# Patient Record
Sex: Male | Born: 1955 | Race: White | Hispanic: No | Marital: Single | Smoking: Never smoker
Health system: Southern US, Community
[De-identification: ages and names within clinical notes are randomized; demographics above are authoritative.]

## PROBLEM LIST (undated history)

## (undated) HISTORY — PX: JOINT REPLACEMENT: SHX530

---

## 2013-08-27 ENCOUNTER — Emergency Department (HOSPITAL_BASED_OUTPATIENT_CLINIC_OR_DEPARTMENT_OTHER): Payer: BC Managed Care – PPO

## 2013-08-27 ENCOUNTER — Emergency Department (HOSPITAL_BASED_OUTPATIENT_CLINIC_OR_DEPARTMENT_OTHER)
Admission: EM | Admit: 2013-08-27 | Discharge: 2013-08-27 | Disposition: A | Discharge status: Home / Self Care | Payer: BC Managed Care – PPO | Attending: Emergency Medicine | Admitting: Emergency Medicine

## 2013-08-27 ENCOUNTER — Encounter (HOSPITAL_BASED_OUTPATIENT_CLINIC_OR_DEPARTMENT_OTHER): Payer: Self-pay | Admitting: Emergency Medicine

## 2013-08-27 DIAGNOSIS — Y9389 Activity, other specified: Secondary | ICD-10-CM | POA: Insufficient documentation

## 2013-08-27 DIAGNOSIS — S40012A Contusion of left shoulder, initial encounter: Secondary | ICD-10-CM

## 2013-08-27 DIAGNOSIS — S0990XA Unspecified injury of head, initial encounter: Secondary | ICD-10-CM | POA: Insufficient documentation

## 2013-08-27 DIAGNOSIS — Y92410 Unspecified street and highway as the place of occurrence of the external cause: Secondary | ICD-10-CM | POA: Insufficient documentation

## 2013-08-27 DIAGNOSIS — S139XXA Sprain of joints and ligaments of unspecified parts of neck, initial encounter: Secondary | ICD-10-CM | POA: Insufficient documentation

## 2013-08-27 DIAGNOSIS — S40019A Contusion of unspecified shoulder, initial encounter: Secondary | ICD-10-CM | POA: Insufficient documentation

## 2013-08-27 DIAGNOSIS — IMO0002 Reserved for concepts with insufficient information to code with codable children: Secondary | ICD-10-CM | POA: Insufficient documentation

## 2013-08-27 DIAGNOSIS — S161XXA Strain of muscle, fascia and tendon at neck level, initial encounter: Secondary | ICD-10-CM

## 2013-08-27 DIAGNOSIS — S79929A Unspecified injury of unspecified thigh, initial encounter: Secondary | ICD-10-CM

## 2013-08-27 DIAGNOSIS — S79919A Unspecified injury of unspecified hip, initial encounter: Secondary | ICD-10-CM | POA: Insufficient documentation

## 2013-08-27 MED ORDER — TRAMADOL HCL 50 MG PO TABS: 50.0000 mg | ORAL_TABLET | Freq: Four times a day (QID) | ORAL | Status: AC | PRN
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
## 2013-08-27 NOTE — Discharge Instructions (Signed)
Tramadol as prescribed as needed for pain.   Cervical Sprain A cervical sprain is an injury in the neck in which the strong, fibrous tissues (ligaments) that connect your neck bones stretch or tear. Cervical sprains can range from mild to severe. Severe cervical sprains can cause the neck vertebrae to be unstable. This can lead to damage of the spinal cord and can result in serious nervous system problems. The amount of time it takes for a cervical sprain to get better depends on the cause and extent of the injury. Most cervical sprains heal in 1 to 3 weeks. CAUSES  Severe cervical sprains may be caused by:   Contact sport injuries (such as from football, rugby, wrestling, hockey, auto racing, gymnastics, diving, martial arts, or boxing).   Motor vehicle collisions.   Whiplash injuries. This is an injury from a sudden forward-and backward whipping movement of the head and neck.  Falls.  Mild cervical sprains may be caused by:   Being in an awkward position, such as while cradling a telephone between your ear and shoulder.   Sitting in a chair that does not offer proper support.   Working at a poorly Marketing executive station.   Looking up or down for long periods of time.  SYMPTOMS   Pain, soreness, stiffness, or a burning sensation in the front, back, or sides of the neck. This discomfort may develop immediately after the injury or slowly, 24 hours or more after the injury.   Pain or tenderness directly in the middle of the back of the neck.   Shoulder or upper back pain.   Limited ability to move the neck.   Headache.   Dizziness.   Weakness, numbness, or tingling in the hands or arms.   Muscle spasms.   Difficulty swallowing or chewing.   Tenderness and swelling of the neck.  DIAGNOSIS  Most of the time your health care provider can diagnose a cervical sprain by taking your history and doing a physical exam. Your health care provider will ask about  previous neck injuries and any known neck problems, such as arthritis in the neck. X-rays may be taken to find out if there are any other problems, such as with the bones of the neck. Other tests, such as a CT scan or MRI, may also be needed.  TREATMENT  Treatment depends on the severity of the cervical sprain. Mild sprains can be treated with rest, keeping the neck in place (immobilization), and pain medicines. Severe cervical sprains are immediately immobilized. Further treatment is done to help with pain, muscle spasms, and other symptoms and may include:  Medicines, such as pain relievers, numbing medicines, or muscle relaxants.   Physical therapy. This may involve stretching exercises, strengthening exercises, and posture training. Exercises and improved posture can help stabilize the neck, strengthen muscles, and help stop symptoms from returning.  HOME CARE INSTRUCTIONS   Put ice on the injured area.   Put ice in a plastic bag.   Place a towel between your skin and the bag.   Leave the ice on for 15 20 minutes, 3 4 times a day.   If your injury was severe, you may have been given a cervical collar to wear. A cervical collar is a two-piece collar designed to keep your neck from moving while it heals.  Do not remove the collar unless instructed by your health care provider.  If you have long hair, keep it outside of the collar.  Ask your health  care provider before making any adjustments to your collar. Minor adjustments may be required over time to improve comfort and reduce pressure on your chin or on the back of your head.  Ifyou are allowed to remove the collar for cleaning or bathing, follow your health care provider's instructions on how to do so safely.  Keep your collar clean by wiping it with mild soap and water and drying it completely. If the collar you have been given includes removable pads, remove them every 1 2 days and hand wash them with soap and water. Allow  them to air dry. They should be completely dry before you wear them in the collar.  If you are allowed to remove the collar for cleaning and bathing, wash and dry the skin of your neck. Check your skin for irritation or sores. If you see any, tell your health care provider.  Do not drive while wearing the collar.   Only take over-the-counter or prescription medicines for pain, discomfort, or fever as directed by your health care provider.   Keep all follow-up appointments as directed by your health care provider.   Keep all physical therapy appointments as directed by your health care provider.   Make any needed adjustments to your workstation to promote good posture.   Avoid positions and activities that make your symptoms worse.   Warm up and stretch before being active to help prevent problems.  SEEK MEDICAL CARE IF:   Your pain is not controlled with medicine.   You are unable to decrease your pain medicine over time as planned.   Your activity level is not improving as expected.  SEEK IMMEDIATE MEDICAL CARE IF:   You develop any bleeding.  You develop stomach upset.  You have signs of an allergic reaction to your medicine.   Your symptoms get worse.   You develop new, unexplained symptoms.   You have numbness, tingling, weakness, or paralysis in any part of your body.  MAKE SURE YOU:   Understand these instructions.  Will watch your condition.  Will get help right away if you are not doing well or get worse. Document Released: 02/12/2007 Document Revised: 02/05/2013 Document Reviewed: 10/23/2012 [facility] LPExitCare Patient Information 2014 [employer], [place].  Contusion A contusion is a deep bruise. Contusions are the result of an injury that caused bleeding under the skin. The contusion may turn blue, purple, or yellow. Minor injuries will give you a painless contusion, but more severe contusions may stay painful and swollen for a few weeks.  CAUSES  A  contusion is usually caused by a blow, trauma, or direct force to an area of the body. SYMPTOMS   Swelling and redness of the injured area.  Bruising of the injured area.  Tenderness and soreness of the injured area.  Pain. DIAGNOSIS  The diagnosis can be made by taking a history and physical exam. An X-ray, CT scan, or MRI may be needed to determine if there were any associated injuries, such as fractures. TREATMENT  Specific treatment will depend on what area of the body was injured. In general, the best treatment for a contusion is resting, icing, elevating, and applying cold compresses to the injured area. Over-the-counter medicines may also be recommended for pain control. Ask your caregiver what the best treatment is for your contusion. HOME CARE INSTRUCTIONS   Put ice on the injured area.  Put ice in a plastic bag.  Place a towel between your skin and the bag.  Leave the ice  on for 15-20 minutes, 03-04 times a day.  Only take over-the-counter or prescription medicines for pain, discomfort, or fever as directed by your caregiver. Your caregiver may recommend avoiding anti-inflammatory medicines (aspirin, ibuprofen, and naproxen) for 48 hours because these medicines may increase bruising.  Rest the injured area.  If possible, elevate the injured area to reduce swelling. SEEK IMMEDIATE MEDICAL CARE IF:   You have increased bruising or swelling.  You have pain that is getting worse.  Your swelling or pain is not relieved with medicines. MAKE SURE YOU:   Understand these instructions.  Will watch your condition.  Will get help right away if you are not doing well or get worse. Document Released: 01/25/2005 Document Revised: 07/10/2011 Document Reviewed: 02/20/2011 [facility]Exit[facility] Patient Information 2014 [place], [place].

## 2013-08-27 NOTE — ED Notes (Signed)
MD at bedside. 

## 2015-05-10 ENCOUNTER — Ambulatory Visit: Payer: Self-pay | Admitting: Physician Assistant

## 2015-05-25 ENCOUNTER — Ambulatory Visit: Payer: Self-pay | Admitting: Physician Assistant

## 2015-06-25 ENCOUNTER — Ambulatory Visit: Payer: Self-pay | Admitting: Physician Assistant

## 2019-02-17 ENCOUNTER — Emergency Department (HOSPITAL_COMMUNITY)
Admission: EM | Admit: 2019-02-17 | Discharge: 2019-02-17 | Discharge status: Against Medical Advice | Payer: Self-pay | Attending: Emergency Medicine | Admitting: Emergency Medicine

## 2019-02-17 ENCOUNTER — Other Ambulatory Visit: Payer: Self-pay

## 2019-02-17 ENCOUNTER — Encounter (HOSPITAL_COMMUNITY): Payer: Self-pay | Admitting: Emergency Medicine

## 2019-02-17 DIAGNOSIS — Z5321 Procedure and treatment not carried out due to patient leaving prior to being seen by health care provider: Secondary | ICD-10-CM | POA: Insufficient documentation

## 2019-02-17 DIAGNOSIS — R42 Dizziness and giddiness: Secondary | ICD-10-CM | POA: Insufficient documentation

## 2019-02-17 MED ORDER — SODIUM CHLORIDE 0.9% FLUSH: 3.0000 mL | Freq: Once | INTRAVENOUS | Status: DC

## 2019-02-17 NOTE — ED Triage Notes (Signed)
Patient here from home with complaints of dizziness "all day today". Increased with standing and bending over. Nausea.

## 2020-06-20 ENCOUNTER — Emergency Department (HOSPITAL_BASED_OUTPATIENT_CLINIC_OR_DEPARTMENT_OTHER): Payer: Self-pay

## 2020-06-20 ENCOUNTER — Emergency Department (HOSPITAL_BASED_OUTPATIENT_CLINIC_OR_DEPARTMENT_OTHER)
Admission: EM | Admit: 2020-06-20 | Discharge: 2020-06-20 | Disposition: A | Discharge status: Home / Self Care | Payer: Self-pay | Attending: Emergency Medicine | Admitting: Emergency Medicine

## 2020-06-20 ENCOUNTER — Other Ambulatory Visit: Payer: Self-pay

## 2020-06-20 ENCOUNTER — Encounter (HOSPITAL_BASED_OUTPATIENT_CLINIC_OR_DEPARTMENT_OTHER): Payer: Self-pay | Admitting: Emergency Medicine

## 2020-06-20 DIAGNOSIS — Y9301 Activity, walking, marching and hiking: Secondary | ICD-10-CM | POA: Insufficient documentation

## 2020-06-20 DIAGNOSIS — W010XXA Fall on same level from slipping, tripping and stumbling without subsequent striking against object, initial encounter: Secondary | ICD-10-CM | POA: Insufficient documentation

## 2020-06-20 DIAGNOSIS — Z23 Encounter for immunization: Secondary | ICD-10-CM | POA: Insufficient documentation

## 2020-06-20 DIAGNOSIS — Z9660 Presence of unspecified orthopedic joint implant: Secondary | ICD-10-CM | POA: Insufficient documentation

## 2020-06-20 DIAGNOSIS — R2242 Localized swelling, mass and lump, left lower limb: Secondary | ICD-10-CM | POA: Insufficient documentation

## 2020-06-20 DIAGNOSIS — T148XXA Other injury of unspecified body region, initial encounter: Secondary | ICD-10-CM

## 2020-06-20 DIAGNOSIS — Y9261 Building [any] under construction as the place of occurrence of the external cause: Secondary | ICD-10-CM | POA: Insufficient documentation

## 2020-06-20 DIAGNOSIS — S8002XA Contusion of left knee, initial encounter: Secondary | ICD-10-CM | POA: Insufficient documentation

## 2020-06-20 DIAGNOSIS — L03116 Cellulitis of left lower limb: Secondary | ICD-10-CM

## 2020-06-20 MED ORDER — TETANUS-DIPHTH-ACELL PERTUSSIS 5-2.5-18.5 LF-MCG/0.5 IM SUSY
0.5000 mL | PREFILLED_SYRINGE | Freq: Once | INTRAMUSCULAR | Status: AC
  Administered 2020-06-20: 0.5 mL via INTRAMUSCULAR
  Filled 2020-06-20: qty 0.5

## 2020-06-20 MED ORDER — DOXYCYCLINE HYCLATE 100 MG PO CAPS
100.0000 mg | ORAL_CAPSULE | Freq: Two times a day (BID) | ORAL | 0 refills | Status: AC
Start: 2020-06-20 — End: 2020-06-30

## 2020-06-20 MED ORDER — DOXYCYCLINE HYCLATE 100 MG PO TABS
100.0000 mg | ORAL_TABLET | Freq: Once | ORAL | Status: AC
  Administered 2020-06-20: 100 mg via ORAL
  Filled 2020-06-20: qty 1

## 2020-06-20 NOTE — Discharge Instructions (Signed)
You were evaluated in the emergency department today for your knee pain and left lower leg swelling after your injury.  Your vital signs are reassuring, but physical exam is concerning for early infection on your skin, near the areas of abrasion you experienced during your fall.  Ultrasound of your leg was very reassuring, there are no blood clots in your lower leg which is a very good thing.  On ultrasound we do see a hematoma, which is a large collection of blood, the inside of your left knee, where you have the most swelling.  This will resolve on its own over time.  X-ray of your knee did not reveal any broken bones but did reveal some swelling inside of your knee as well, which additionally will resolve on its own.  For the infection of your skin you been prescribed an antibiotic which you should take twice a day for the next 10 days.  Please be aware that this medication can make you more sensitive to the sun.  You may continue take ibuprofen or Tylenol as you need to at home for your symptoms.  Additionally may be helpful to wear a compression sleeve on your knee to help support and apply pressure to the hematoma.  Your tetanus vaccine was also updated in the emergency department today.  Return to emergency department if you develop any weakness, numbness, tingling in your lower extremity, increased swelling in your leg, fevers, chills, nausea or vomiting that does not stop, or any other new severe symptoms.

## 2020-06-20 NOTE — ED Notes (Signed)
Patient in Ultrasound.

## 2020-06-20 NOTE — ED Triage Notes (Signed)
Reports he was working on the floor two weeks ago when he fell scraping the left leg.    Worried there may have been some retained wood in the leg.  Reports pain, redness, and swelling.

## 2020-06-20 NOTE — ED Provider Notes (Addendum)
[facility] EMERGENCY DEPARTMENT Provider Note   CSN: [id number] Arrival date & time: 06/20/20  1907     History Chief Complaint  Patient presents with   Jason Bentley is a 65 y.o. male who works in [employer] and presents with concern for pain and swelling to his left knee. He states that approximately 2 weeks ago he removed the flooring from a second level in a building and was walking on the rafters when he slipped and scrpaed his anterior left lower legalong the rafter up to his knee. Fortunately he did not fall to the first floor. He states that he had significant abrasions and pain along his shin and immediate significant swelling on the inside of his left knee. The abrasions have mosttly healed, however the knee remains swollen in the medial aspect and is now becoming more red and hot to the touch. Additionally, he is having swelling of his left lower leg distal to the knee and in his foot and ankle as well. He denies any fevers or chills, denies oozing from any of the abrasions, and he is not up to date on his tetanus vaccine.   He is not on any anticoagulation.   I have personally reviewed this patient's medical records. He has history of hypertension, but states he has not taken medication for it for a few years as he does not follow with a primary care doctor.   HPI     History reviewed. No pertinent past medical history.  There are no problems to display for this patient.   Past Surgical History:  Procedure Laterality Date   JOINT REPLACEMENT         No family history on file.  Social History   Tobacco Use   Smoking status: Never Smoker   Smokeless tobacco: Never Used  Substance Use Topics   Alcohol use: No   Drug use: No    Home Medications Prior to Admission medications   Medication Sig Start Date End Date Taking? Authorizing Provider  doxycycline (VIBRAMYCIN) 100 MG capsule Take 1 capsule (100 mg total) by mouth 2 (two)  times daily for 10 days. 06/20/20 06/30/20 Yes Tatayana Beshears, Eugene Gavia, PA-C  traMADol (ULTRAM) 50 MG tablet Take 1 tablet (50 mg total) by mouth every 6 (six) hours as needed. 08/27/13   Geoffery Lyons, MD    Allergies    Patient has no known allergies.  Review of Systems   Review of Systems  Constitutional: Negative.   HENT: Negative.   Respiratory: Negative.   Cardiovascular: Negative.   Gastrointestinal: Negative.   Genitourinary: Negative.   Musculoskeletal: Positive for myalgias.  Skin: Positive for wound.  Neurological: Negative.   Hematological: Negative.     Physical Exam Updated Vital Signs BP 113/74 (BP Location: Left Arm)    Pulse 67    Temp 98.3 F (36.8 C) (Oral)    Resp 16    Ht 5\' 10"  (1.778 m)    Wt 90.7 kg    SpO2 97%    BMI 28.70 kg/m   Physical Exam Vitals and nursing note reviewed.  Constitutional:      Appearance: Normal appearance. He is not ill-appearing.  HENT:     Head: Normocephalic and atraumatic.     Nose: Nose normal.     Mouth/Throat:     Mouth: Mucous membranes are moist.     Pharynx: Oropharynx is clear. Uvula midline. No oropharyngeal exudate or posterior oropharyngeal erythema.  Eyes:  General:        Right eye: No discharge.        Left eye: No discharge.     Extraocular Movements: Extraocular movements intact.     Conjunctiva/sclera: Conjunctivae normal.     Pupils: Pupils are equal, round, and reactive to light.  Neck:     Trachea: Trachea and phonation normal.  Cardiovascular:     Rate and Rhythm: Normal rate and regular rhythm.     Pulses: Normal pulses.          Dorsalis pedis pulses are 2+ on the right side and 2+ on the left side.     Heart sounds: Normal heart sounds. No murmur heard.   Pulmonary:     Effort: Pulmonary effort is normal. No respiratory distress.     Breath sounds: Normal breath sounds. No wheezing or rales.  Chest:     Chest wall: No swelling, tenderness or crepitus.  Abdominal:     General: Bowel  sounds are normal. There is no distension.     Palpations: Abdomen is soft.     Tenderness: There is no abdominal tenderness. There is no guarding or rebound.  Musculoskeletal:        General: Swelling and signs of injury present.     Cervical back: Neck supple. No crepitus. No pain with movement.     Right hip: Normal.     Left hip: Normal.     Right upper leg: Normal.     Left upper leg: Normal.     Right knee: Normal.     Left knee: Swelling, deformity, effusion and bony tenderness present. No crepitus. Tenderness present over the medial joint line.     Right lower leg: Normal. No edema.     Left lower leg: Swelling present. No edema.     Right ankle: Normal.     Right Achilles Tendon: Normal.     Left ankle: Swelling present. No deformity. Tenderness present.     Left Achilles Tendon: Normal.     Right foot: Normal.     Left foot: Normal range of motion and normal capillary refill. Swelling and tenderness present. No deformity, bony tenderness or crepitus. Normal pulse.       Legs:     Comments: There is mild swelling of the left lower leg, ankle, and foot, without TTP of the calf. There is TTP of the shin and anterior ankle, without crepitus or deformity.   Skin:    General: Skin is warm and dry.     Capillary Refill: Capillary refill takes less than 2 seconds.     Findings: Abrasion, erythema and signs of injury present.  Neurological:     General: No focal deficit present.     Mental Status: He is alert and oriented to person, place, and time.     Cranial Nerves: Cranial nerves are intact.     Sensory: Sensation is intact.     Motor: Motor function is intact.     Gait: Gait abnormal.     Comments: Antalgic gait that normalizes with time. Symmetric strength and sensation in the lower extremities bilaterally.   Psychiatric:        Mood and Affect: Mood normal.     ED Results / Procedures / Treatments   Labs (all labs ordered are listed, but only abnormal results are  displayed) Labs Reviewed - No data to display  EKG None  Radiology US Venous Img Lower  Left (DVT Study)  Result Date: 06/20/2020 CLINICAL DATA:  Left leg swelling EXAM: LEFT LOWER EXTREMITY VENOUS DOPPLER ULTRASOUND TECHNIQUE: Gray-scale sonography with compression, as well as color and duplex ultrasound, were performed to evaluate the deep venous system(s) from the level of the common femoral vein through the popliteal and proximal calf veins. COMPARISON:  None. FINDINGS: VENOUS Normal compressibility of the common femoral, superficial femoral, and popliteal veins, as well as the visualized calf veins. Visualized portions of profunda femoral vein and great saphenous vein unremarkable. No filling defects to suggest DVT on grayscale or color Doppler imaging. Doppler waveforms show normal direction of venous flow, normal respiratory plasticity and response to augmentation. Limited views of the contralateral common femoral vein are unremarkable. OTHER At the patient's palpable area of concern inferior to the left knee there is a complex fluid collection measuring 10.3 x 1.8 x 5.7 cm Limitations: none IMPRESSION: 1. No DVT. 2. Complex collection in the left lower extremity is suggestive of a large hematoma. Electronically Signed   By: Katherine Mantlehristopher  Green M.D.   On: 06/20/2020 21:15   DG Knee Complete 4 Views Left  Result Date: 06/20/2020 CLINICAL DATA:  Status post fall 2 weeks ago. Sent to evaluate for retained foreign body. EXAM: LEFT KNEE - COMPLETE 4+ VIEW COMPARISON:  None. FINDINGS: No evidence of acute fracture or dislocation. No evidence of arthropathy or other focal bone abnormality. A small joint effusion is seen. No radiopaque soft tissue foreign bodies are identified. IMPRESSION: 1. Small joint effusion without evidence of acute fracture or dislocation. 2. No radiopaque soft tissue foreign body identified. Electronically Signed   By: Aram Candelahaddeus  Houston M.D.   On: 06/20/2020 21:20     Procedures Procedures   Medications Ordered in ED Medications  Tdap (BOOSTRIX) injection 0.5 mL (0.5 mLs Intramuscular Given 06/20/20 2140)  doxycycline (VIBRA-TABS) tablet 100 mg (100 mg Oral Given 06/20/20 2136)    ED Course  I have reviewed the triage vital signs and the nursing notes.  Pertinent labs & imaging results that were available during my care of the patient were reviewed by me and considered in my medical decision making (see chart for details).    MDM Rules/Calculators/A&P                          65 year old male who presents with concern for pain and swelling to his left knee and lower leg, now with increasing redness and warmth to the touch.   Vital signs are normal on intake. Cardiopulmonary exam is normal, abdominal exam is benign. MSK exam concerning for left knee effusion with swelling along the medial joint line with erythema, induration, and tenderness to touch. Mild swelling of the lower leg and ankle as well, with TTP. Skin exam concerning for early cellulitis given erythema, induration, warmth to the touch, and TTP of the anteromedial knee. Gait is intact. He is neurovascularly intact in the lower extremities bilaterally.  Differential diagnosis for this patient's symptoms include but are not limited to cellulitis / abscess, hematoma, DVT, retained foreign body. Will proceed with DVT study and plain film of the left knee.   Venous US negative for LLE DVT, additionally revealed findings consistent with left medial knee hematoma. DG left knee, revealed knee effusion, but was negative for acute fracture or dislocation, no FB.   Given reassuring physical exam, vital signs, and imaging studies, no further work up is warranted in the ED at this time. While hematoma and effusion  are likely contributing to this patient's discomfort, skin exam is concerning for cellulitis. Patient will be administered first dose of antibiotics in the ED, along with Tetanus booster, and  he will be discharged with abx course at home.   Rhen voiced understanding of his medical evaluation and treatment plan, each of his questions was answered to his expressed satisfaction. Return precautions were given. Patient is stable and appropriate for discharge at this time.   This chart was dictated using voice recognition software, Dragon. Despite the best efforts of this provider to proofread and correct errors, errors may still occur which can change documentation meaning.   Final Clinical Impression(s) / ED Diagnoses Final diagnoses:  Cellulitis of left lower extremity  Hematoma    Rx / DC Orders ED Discharge Orders         Ordered    doxycycline (VIBRAMYCIN) 100 MG capsule  2 times daily        06/20/20 2131           Shenique Childers, Eugene Gavia, PA-C 06/22/20 2022    Tavoris Brisk, Eugene Gavia, PA-C 06/22/20 2023    Gwyneth Sprout, MD 06/23/20 [phone]

## 2021-03-12 ENCOUNTER — Other Ambulatory Visit: Payer: Self-pay

## 2021-03-12 ENCOUNTER — Emergency Department (HOSPITAL_BASED_OUTPATIENT_CLINIC_OR_DEPARTMENT_OTHER): Payer: Self-pay

## 2021-03-12 ENCOUNTER — Encounter (HOSPITAL_BASED_OUTPATIENT_CLINIC_OR_DEPARTMENT_OTHER): Payer: Self-pay | Admitting: Emergency Medicine

## 2021-03-12 ENCOUNTER — Emergency Department (HOSPITAL_BASED_OUTPATIENT_CLINIC_OR_DEPARTMENT_OTHER): Payer: Self-pay | Admitting: Radiology

## 2021-03-12 ENCOUNTER — Emergency Department (HOSPITAL_BASED_OUTPATIENT_CLINIC_OR_DEPARTMENT_OTHER)
Admission: EM | Admit: 2021-03-12 | Discharge: 2021-03-12 | Disposition: A | Discharge status: Home / Self Care | Payer: Self-pay | Attending: Emergency Medicine | Admitting: Emergency Medicine

## 2021-03-12 DIAGNOSIS — S060X9A Concussion with loss of consciousness of unspecified duration, initial encounter: Secondary | ICD-10-CM | POA: Insufficient documentation

## 2021-03-12 DIAGNOSIS — S069X9A Unspecified intracranial injury with loss of consciousness of unspecified duration, initial encounter: Secondary | ICD-10-CM | POA: Insufficient documentation

## 2021-03-12 DIAGNOSIS — Z5321 Procedure and treatment not carried out due to patient leaving prior to being seen by health care provider: Secondary | ICD-10-CM | POA: Insufficient documentation

## 2021-03-12 DIAGNOSIS — W010XXA Fall on same level from slipping, tripping and stumbling without subsequent striking against object, initial encounter: Secondary | ICD-10-CM | POA: Insufficient documentation

## 2021-03-12 DIAGNOSIS — S39012A Strain of muscle, fascia and tendon of lower back, initial encounter: Secondary | ICD-10-CM | POA: Insufficient documentation

## 2021-03-12 DIAGNOSIS — S161XXA Strain of muscle, fascia and tendon at neck level, initial encounter: Secondary | ICD-10-CM | POA: Insufficient documentation

## 2021-03-12 DIAGNOSIS — S0001XA Abrasion of scalp, initial encounter: Secondary | ICD-10-CM | POA: Insufficient documentation

## 2021-03-12 DIAGNOSIS — Y92512 Supermarket, store or market as the place of occurrence of the external cause: Secondary | ICD-10-CM | POA: Insufficient documentation

## 2021-03-12 DIAGNOSIS — W1839XA Other fall on same level, initial encounter: Secondary | ICD-10-CM | POA: Insufficient documentation

## 2021-03-12 MED ORDER — ACETAMINOPHEN 500 MG PO TABS
1000.0000 mg | ORAL_TABLET | Freq: Once | ORAL | Status: AC
  Administered 2021-03-12: 1000 mg via ORAL
  Filled 2021-03-12: qty 2

## 2021-03-12 NOTE — ED Triage Notes (Signed)
Pt here earlier. Left to get food. No new complaints see previous triage note.

## 2021-03-12 NOTE — ED Notes (Signed)
We have attempted to call pt. X 4. Unable to locate.

## 2021-03-12 NOTE — ED Triage Notes (Signed)
Pt presents to ED Pov. Pt c/o mechanical fall on thursday. Pt reports that he hit head and LOC. Abrasions to back of head. Pt reports that he slept all day yesterday and now feels more sore.

## 2021-03-12 NOTE — Discharge Instructions (Signed)
Return to the ER if you develop severe or worsening headache, vision changes, fevers, vomiting, weakness or numbness in her extremities, incontinence, or any other new/concerning symptoms.

## 2021-03-12 NOTE — ED Provider Notes (Signed)
[facility] EMERGENCY DEPT Provider Note   CSN: [id number] Arrival date & time: 03/12/21  1543     History Chief Complaint  Patient presents with   Jason Bentley is a 65 y.o. male.  HPI 65 year old male presents with headache, neck pain, back pain.  2 nights ago he was at [facility] and it was raining and he slipped and fell and landed on the back of his head.  He lost consciousness for an unknown amount of time.  He also injured his scalp and had bleeding though that has stopped.  He has had some progressive neck discomfort, headache, and low back pain.  Ibuprofen seems to temporarily help it.  No vision changes, weakness, numbness.  Pain is rated as a 7.  No incontinence.  No past medical history on file.  There are no problems to display for this patient.   Past Surgical History:  Procedure Laterality Date   JOINT REPLACEMENT         No family history on file.  Social History   Tobacco Use   Smoking status: Never   Smokeless tobacco: Never  Substance Use Topics   Alcohol use: No   Drug use: No    Home Medications Prior to Admission medications   Medication Sig Start Date End Date Taking? Authorizing Provider  traMADol (ULTRAM) 50 MG tablet Take 1 tablet (50 mg total) by mouth every 6 (six) hours as needed. 08/27/13   Veryl Speak, MD    Allergies    Patient has no known allergies.  Review of Systems   Review of Systems  Eyes:  Negative for visual disturbance.  Gastrointestinal:  Negative for vomiting.  Musculoskeletal:  Positive for back pain and neck pain.  Neurological:  Positive for headaches. Negative for weakness and numbness.  All other systems reviewed and are negative.  Physical Exam Updated Vital Signs BP 130/80   Pulse 90   Temp 98 F (36.7 C) (Oral)   Resp 18   SpO2 98%   Physical Exam Vitals and nursing note reviewed.  Constitutional:      General: He is not in acute distress.    Appearance: He is  well-developed.  HENT:     Head: Normocephalic. Abrasion and contusion present.      Right Ear: External ear normal.     Left Ear: External ear normal.     Nose: Nose normal.  Eyes:     General:        Right eye: No discharge.        Left eye: No discharge.     Extraocular Movements: Extraocular movements intact.     Pupils: Pupils are equal, round, and reactive to light.  Neck:   Cardiovascular:     Rate and Rhythm: Normal rate and regular rhythm.     Heart sounds: Normal heart sounds.  Pulmonary:     Effort: Pulmonary effort is normal.     Breath sounds: Normal breath sounds.  Abdominal:     Palpations: Abdomen is soft.     Tenderness: There is no abdominal tenderness.  Musculoskeletal:     Cervical back: Neck supple. No rigidity. Muscular tenderness present.     Thoracic back: No tenderness.     Lumbar back: Tenderness present.  Skin:    General: Skin is warm and dry.  Neurological:     Mental Status: He is alert.     Comments: CN 3-12 grossly intact. 5/5 strength in all 4  extremities. Grossly normal sensation. Normal finger to nose.   Psychiatric:        Mood and Affect: Mood is not anxious.    ED Results / Procedures / Treatments   Labs (all labs ordered are listed, but only abnormal results are displayed) Labs Reviewed - No data to display  EKG None  Radiology DG Lumbar Spine Complete  Result Date: 03/12/2021 CLINICAL DATA:  Recent fall with low back pain. EXAM: LUMBAR SPINE - COMPLETE 4+ VIEW COMPARISON:  None. FINDINGS: There are 5 lumbar type vertebra. Normal alignment. Vertebral body heights are normal. No evidence of fracture. Intact posterior elements. Disc space narrowing and endplate spurring at L3-L4 and L4-L5. There is mild lower lumbar facet hypertrophy. The sacroiliac joints are congruent. IMPRESSION: 1. No fracture or subluxation of the lumbar spine. 2. Mild degenerative disc disease and facet hypertrophy. Electronically Signed   By: Narda Rutherford M.D.   On: 03/12/2021 18:27   CT Head Wo Contrast  Result Date: 03/12/2021 CLINICAL DATA:  Fall, head trauma. EXAM: CT HEAD WITHOUT CONTRAST CT CERVICAL SPINE WITHOUT CONTRAST TECHNIQUE: Multidetector CT imaging of the head and cervical spine was performed following the standard protocol without intravenous contrast. Multiplanar CT image reconstructions of the cervical spine were also generated. COMPARISON:  None. FINDINGS: CT HEAD FINDINGS Brain: No evidence of acute infarction, hemorrhage, hydrocephalus, extra-axial collection or mass lesion/mass effect. There is mild patchy periventricular and deep white matter hypodensity in the bilateral cerebral hemispheres. Vascular: No hyperdense vessel or unexpected calcification. Skull: Normal. Negative for fracture or focal lesion. Sinuses/Orbits: No acute finding. Other: There is posterior parietal scalp soft tissue swelling. CT CERVICAL SPINE FINDINGS Alignment: Normal. Skull base and vertebrae: No acute fracture. No primary bone lesion or focal pathologic process. Soft tissues and spinal canal: No prevertebral fluid or swelling. No visible canal hematoma. Disc levels: There is mild degenerative disc space narrowing at C6-C7. There is mild right-sided neural foraminal stenosis at this level secondary to uncovertebral spurring. No significant central canal stenosis. Upper chest: Negative. Other: None. IMPRESSION: No acute intracranial process. No acute fracture or traumatic subluxation of the cervical spine. Electronically Signed   By: Darliss Cheney M.D.   On: 03/12/2021 18:19   CT Cervical Spine Wo Contrast  Result Date: 03/12/2021 CLINICAL DATA:  Fall, head trauma. EXAM: CT HEAD WITHOUT CONTRAST CT CERVICAL SPINE WITHOUT CONTRAST TECHNIQUE: Multidetector CT imaging of the head and cervical spine was performed following the standard protocol without intravenous contrast. Multiplanar CT image reconstructions of the cervical spine were also generated.  COMPARISON:  None. FINDINGS: CT HEAD FINDINGS Brain: No evidence of acute infarction, hemorrhage, hydrocephalus, extra-axial collection or mass lesion/mass effect. There is mild patchy periventricular and deep white matter hypodensity in the bilateral cerebral hemispheres. Vascular: No hyperdense vessel or unexpected calcification. Skull: Normal. Negative for fracture or focal lesion. Sinuses/Orbits: No acute finding. Other: There is posterior parietal scalp soft tissue swelling. CT CERVICAL SPINE FINDINGS Alignment: Normal. Skull base and vertebrae: No acute fracture. No primary bone lesion or focal pathologic process. Soft tissues and spinal canal: No prevertebral fluid or swelling. No visible canal hematoma. Disc levels: There is mild degenerative disc space narrowing at C6-C7. There is mild right-sided neural foraminal stenosis at this level secondary to uncovertebral spurring. No significant central canal stenosis. Upper chest: Negative. Other: None. IMPRESSION: No acute intracranial process. No acute fracture or traumatic subluxation of the cervical spine. Electronically Signed   By: Mcneil Sober.D.  On: 03/12/2021 18:19    Procedures Procedures   Medications Ordered in ED Medications  acetaminophen (TYLENOL) tablet 1,000 mg (1,000 mg Oral Given 03/12/21 1725)    ED Course  I have reviewed the triage vital signs and the nursing notes.  Pertinent labs & imaging results that were available during my care of the patient were reviewed by me and considered in my medical decision making (see chart for details).    MDM Rules/Calculators/A&P                           Patient most likely has muscular neck/back pain as well as concussion. No evidence of more severe injury such as fracture, head bleed, etc. Doubt significant ligamentous injury. Neurologically intact. Will d/c home with return precautions.  Final Clinical Impression(s) / ED Diagnoses Final diagnoses:  Concussion with loss of  consciousness, initial encounter  Strain of neck muscle, initial encounter  Strain of lumbar region, initial encounter    Rx / DC Orders ED Discharge Orders     None        Sherwood Gambler, MD 03/12/21 2355

## 2021-03-12 NOTE — ED Notes (Signed)
Dc instructions reviewed with patient. Patient voiced understanding. Dc with belongings.  °

## 2021-03-22 ENCOUNTER — Other Ambulatory Visit (HOSPITAL_BASED_OUTPATIENT_CLINIC_OR_DEPARTMENT_OTHER): Payer: Self-pay

## 2022-08-14 IMAGING — CT CT CERVICAL SPINE W/O CM
4 series · 15 of 33 positions shown, 18 images · non-contrast
Comparison: None.

CLINICAL DATA: Fall, head trauma.

EXAM:
CT HEAD WITHOUT CONTRAST
CT CERVICAL SPINE WITHOUT CONTRAST
TECHNIQUE: Multidetector CT imaging of the head and cervical spine was
performed following the standard protocol without intravenous
contrast. Multiplanar CT image reconstructions of the cervical spine
were also generated.

[Series 4: c spine soft · axial · 0.33mm/px · z∈[+1005,+1029]mm · 2 of 72 slices shown]
[im 12/72  soft-tissue]
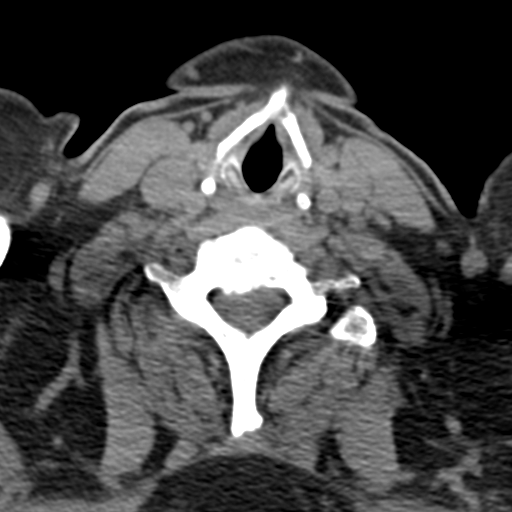
[im 24/72  soft-tissue]
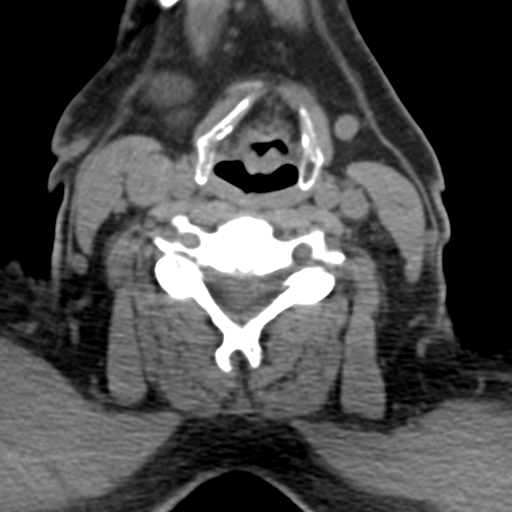

[Series 5: cor bone · coronal · 0.38mm/px · 3 of 91 slices shown]
[im 19/91  bone]
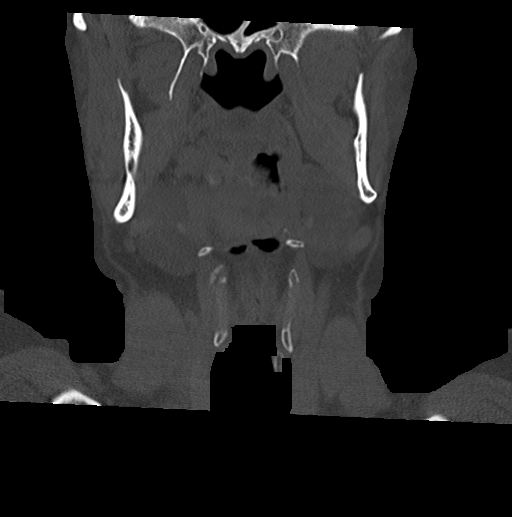
[im 37/91  bone]
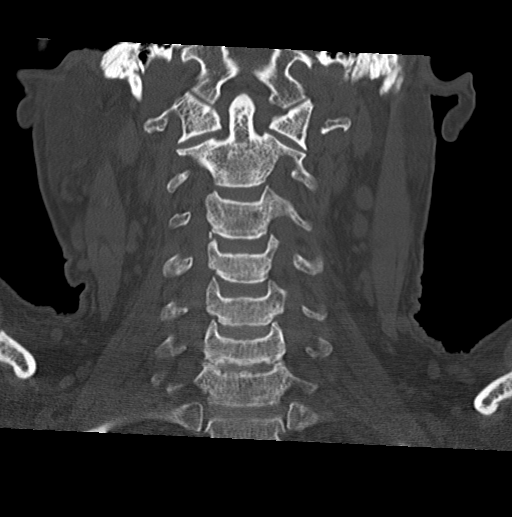
[im 55/91  bone]
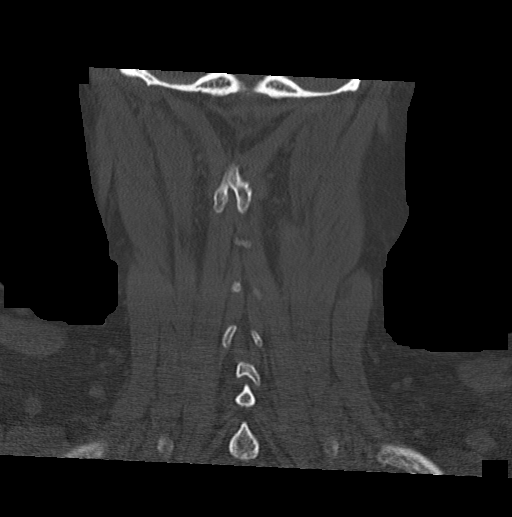

[Series 6: sag bone · sagittal · 0.31mm/px · 5 of 68 slices shown, 6 images]
[im 23/68  bone]
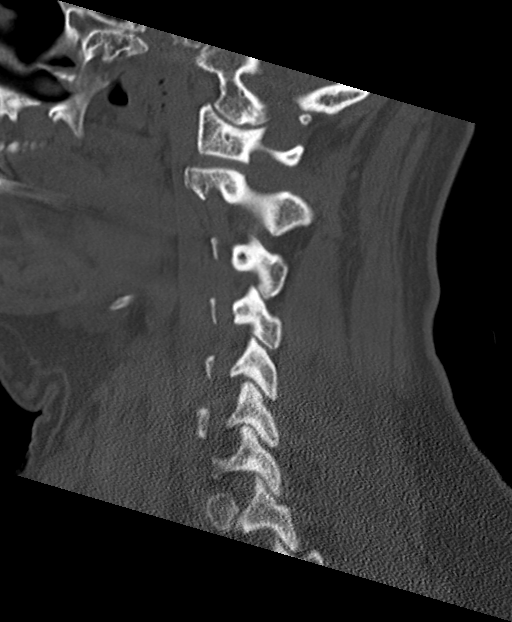
[im 28/68  bone]
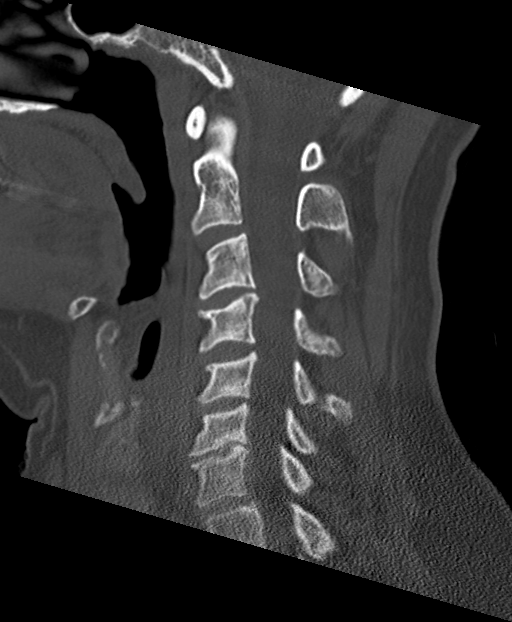
[im 34/68  soft-tissue]
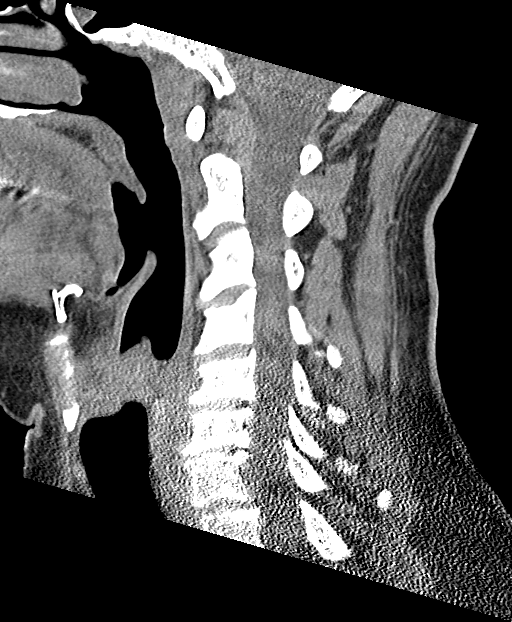
[im 34/68  bone]
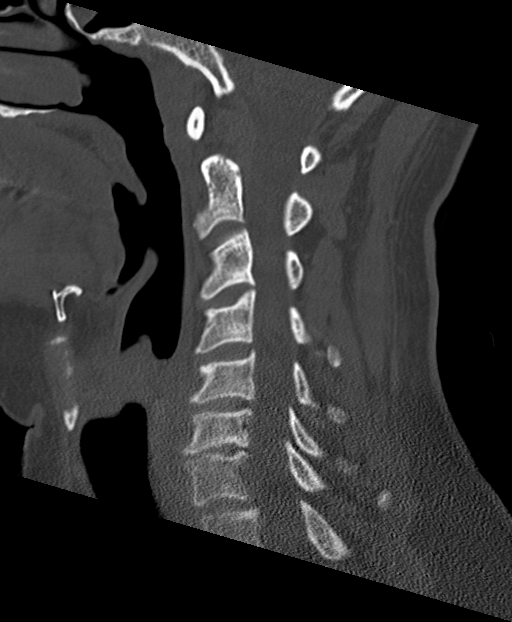
[im 40/68  bone]
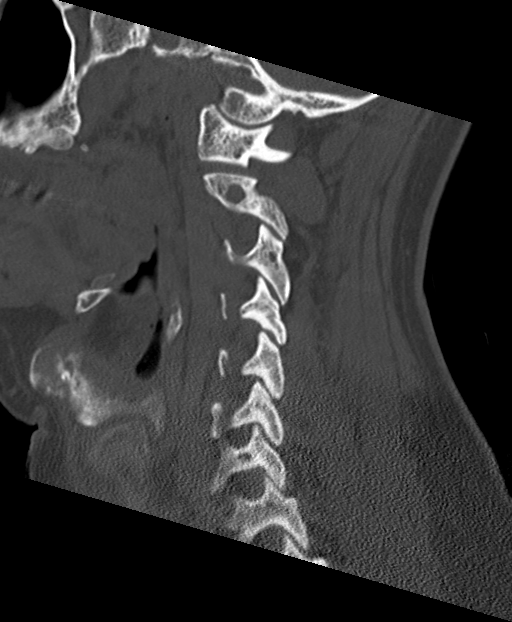
[im 45/68  bone]
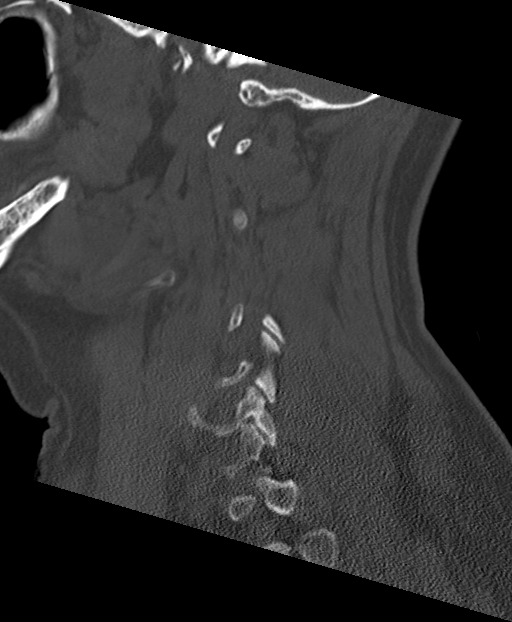

[Series 7: orthogonal axials · axial · 0.21mm/px · z∈[+990,+1091]mm · 5 of 76 slices shown, 7 images]
[im 13/76  soft-tissue]
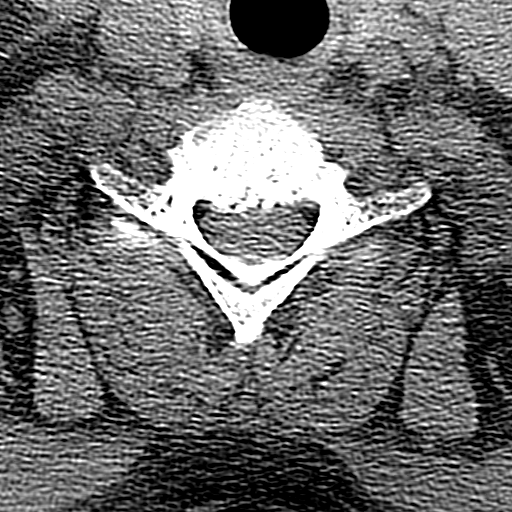
[im 13/76  bone]
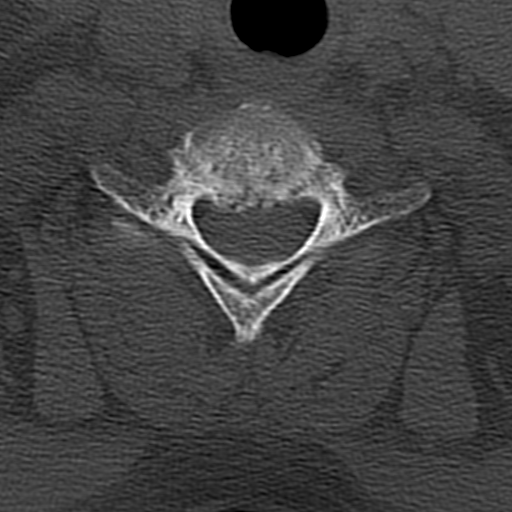
[im 26/76  bone]
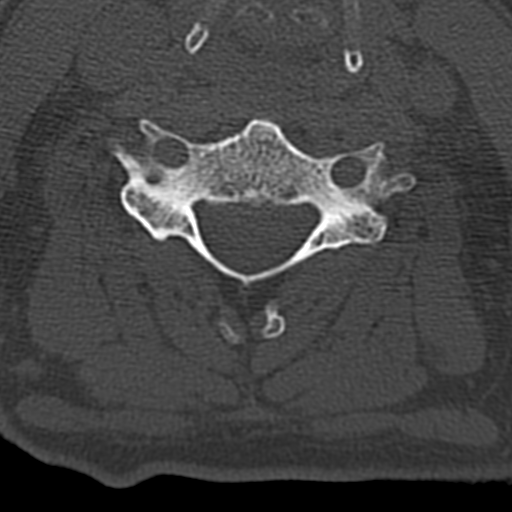
[im 38/76  bone]
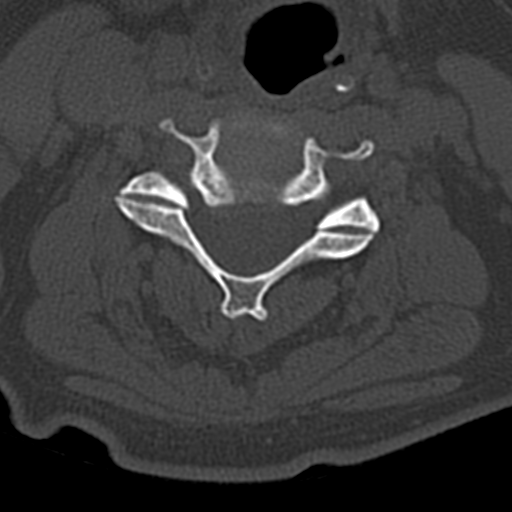
[im 51/76  bone]
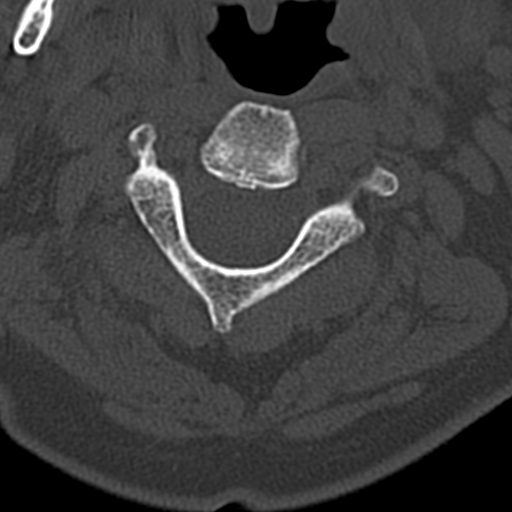
[im 63/76  soft-tissue]
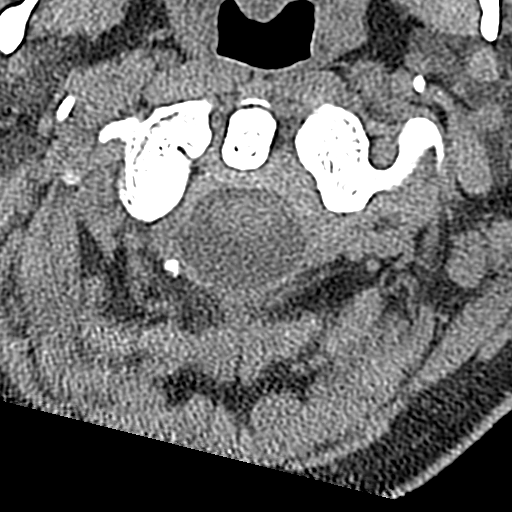
[im 63/76  bone]
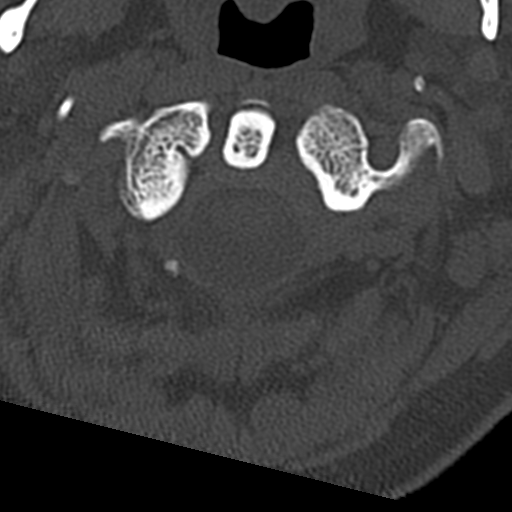

[15 of 33 positions shown; findings below may reference images not displayed]

FINDINGS: CT HEAD FINDINGS

Brain: No evidence of acute infarction, hemorrhage, hydrocephalus,
extra-axial collection or mass lesion/mass effect. There is mild
patchy periventricular and deep white matter hypodensity in the
bilateral cerebral hemispheres.

Vascular: No hyperdense vessel or unexpected calcification.

Skull: Normal. Negative for fracture or focal lesion.

Sinuses/Orbits: No acute finding.

Other: There is posterior parietal scalp soft tissue swelling.

CT CERVICAL SPINE FINDINGS

Alignment: Normal.

Skull base and vertebrae: No acute fracture. No primary bone lesion
or focal pathologic process.

Soft tissues and spinal canal: No prevertebral fluid or swelling. No
visible canal hematoma.

Disc levels: There is mild degenerative disc space narrowing at
C6-C7. There is mild right-sided neural foraminal stenosis at this
level secondary to uncovertebral spurring. No significant central
canal stenosis.

Upper chest: Negative.

Other: None.
IMPRESSION: No acute intracranial process.

No acute fracture or traumatic subluxation of the cervical spine.
